# Patient Record
Sex: Male | Born: 1990 | Race: Black or African American | Hispanic: No | Marital: Single | Smoking: Current every day smoker
Health system: Southern US, Community
[De-identification: ages and names within clinical notes are randomized; demographics above are authoritative.]

## PROBLEM LIST (undated history)

## (undated) HISTORY — PX: TONSILLECTOMY: SUR1361

---
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
## 2015-11-26 ENCOUNTER — Encounter: Payer: Self-pay | Admitting: Medical Oncology

## 2015-11-26 ENCOUNTER — Emergency Department
Admission: EM | Admit: 2015-11-26 | Discharge: 2015-11-26 | Disposition: A | Discharge status: Home / Self Care | Payer: 59 | Attending: Emergency Medicine | Admitting: Emergency Medicine

## 2015-11-26 ENCOUNTER — Emergency Department: Payer: 59

## 2015-11-26 DIAGNOSIS — N23 Unspecified renal colic: Secondary | ICD-10-CM

## 2015-11-26 DIAGNOSIS — R1012 Left upper quadrant pain: Secondary | ICD-10-CM | POA: Diagnosis present

## 2015-11-26 DIAGNOSIS — F17200 Nicotine dependence, unspecified, uncomplicated: Secondary | ICD-10-CM | POA: Insufficient documentation

## 2015-11-26 DIAGNOSIS — K2970 Gastritis, unspecified, without bleeding: Secondary | ICD-10-CM | POA: Insufficient documentation

## 2015-11-26 LAB — URINALYSIS COMPLETE WITH MICROSCOPIC (ARMC ONLY)
Bacteria, UA: NONE SEEN
Bilirubin Urine: NEGATIVE
GLUCOSE, UA: NEGATIVE mg/dL
Hgb urine dipstick: NEGATIVE
KETONES UR: NEGATIVE mg/dL
Leukocytes, UA: NEGATIVE
Nitrite: NEGATIVE
PROTEIN: NEGATIVE mg/dL
Specific Gravity, Urine: 1.026 (ref 1.005–1.030)
Squamous Epithelial / LPF: NONE SEEN
pH: 5 (ref 5.0–8.0)

## 2015-11-26 LAB — CBC
HCT: 40 % (ref 40.0–52.0)
HEMOGLOBIN: 13.1 g/dL (ref 13.0–18.0)
MCH: 29.8 pg (ref 26.0–34.0)
MCHC: 32.9 g/dL (ref 32.0–36.0)
MCV: 90.8 fL (ref 80.0–100.0)
PLATELETS: 245 10*3/uL (ref 150–440)
RBC: 4.4 MIL/uL (ref 4.40–5.90)
RDW: 13.5 % (ref 11.5–14.5)
WBC: 7.1 10*3/uL (ref 3.8–10.6)

## 2015-11-26 LAB — COMPREHENSIVE METABOLIC PANEL
ALT: 16 U/L — AB (ref 17–63)
AST: 23 U/L (ref 15–41)
Albumin: 4.3 g/dL (ref 3.5–5.0)
Alkaline Phosphatase: 65 U/L (ref 38–126)
Anion gap: 5 (ref 5–15)
BUN: 14 mg/dL (ref 6–20)
CALCIUM: 9.2 mg/dL (ref 8.9–10.3)
CO2: 27 mmol/L (ref 22–32)
CREATININE: 0.79 mg/dL (ref 0.61–1.24)
Chloride: 108 mmol/L (ref 101–111)
GFR calc Af Amer: >60 mL/min (ref >60)
GFR calc non Af Amer: >60 mL/min (ref >60)
Glucose, Bld: 106 mg/dL — ABNORMAL HIGH (ref 65–99)
Potassium: 4 mmol/L (ref 3.5–5.1)
SODIUM: 140 mmol/L (ref 135–145)
Total Bilirubin: 0.9 mg/dL (ref 0.3–1.2)
Total Protein: 7.4 g/dL (ref 6.5–8.1)

## 2015-11-26 LAB — LIPASE, BLOOD: Lipase: 36 U/L (ref 11–51)

## 2015-11-26 MED ORDER — GI COCKTAIL ~~LOC~~
ORAL | Status: AC
  Filled 2015-11-26: qty 30

## 2015-11-26 MED ORDER — GI COCKTAIL ~~LOC~~
30.0000 mL | Freq: Once | ORAL | Status: AC
  Administered 2015-11-26: 30 mL via ORAL

## 2015-11-26 MED ORDER — PANTOPRAZOLE SODIUM 40 MG PO TBEC: 40.0000 mg | DELAYED_RELEASE_TABLET | Freq: Every day | ORAL | Status: AC

## 2015-11-26 NOTE — ED Notes (Signed)
Pt reports abdominal pain x 1 month, advises it feels like he is hungry.  Tried MOM and it didn't help any.

## 2015-11-26 NOTE — Discharge Instructions (Signed)
Gastritis, Adult Gastritis is soreness and puffiness (inflammation) of the lining of the stomach. If you do not get help, gastritis can cause bleeding and sores (ulcers) in the stomach. HOME CARE   Only take medicine as told by your doctor.  If you were given antibiotic medicines, take them as told. Finish the medicines even if you start to feel better.  Drink enough fluids to keep your pee (urine) clear or pale yellow.  Avoid foods and drinks that make your problems worse. Foods you may want to avoid include:  Caffeine or alcohol.  Chocolate.  Mint.  Garlic and onions.  Spicy foods.  Citrus fruits, including oranges, lemons, or limes.  Food containing tomatoes, including sauce, chili, salsa, and pizza.  Fried and fatty foods.  Eat small meals throughout the day instead of large meals. GET HELP RIGHT AWAY IF:   You have black or dark red poop (stools).  You throw up (vomit) blood. It may look like coffee grounds.  You cannot keep fluids down.  Your belly (abdominal) pain gets worse.  You have a fever.  You do not feel better after 1 week.  You have any other questions or concerns. MAKE SURE YOU:   Understand these instructions.  Will watch your condition.  Will get help right away if you are not doing well or get worse.   This information is not intended to replace advice given to you by your health care provider. Make sure you discuss any questions you have with your health care provider.   Document Released: 05/24/2008 Document Revised: 02/28/2012 Document Reviewed: 01/19/2012 Elsevier Interactive Patient Education 2016 Elsevier Inc.  Renal Colic Renal colic is pain that is caused by passing a kidney stone. The pain can be sharp and severe. It may be felt in the back, abdomen, side (flank), or groin. It can cause nausea. Renal colic can come and go. HOME CARE INSTRUCTIONS Watch your condition for any changes. The following actions may help to lessen any  discomfort that you are feeling:  Take medicines only as directed by your health care provider.  Ask your health care provider if it is okay to take over-the-counter pain medicine.  Drink enough fluid to keep your urine clear or pale yellow. Drink 6-8 glasses of water each day.  Limit the amount of salt that you eat to less than 2 grams per day.  Reduce the amount of protein in your diet. Eat less meat, fish, nuts, and dairy.  Avoid foods such as spinach, rhubarb, nuts, or bran. These may make kidney stones more likely to form. SEEK MEDICAL CARE IF:  You have a fever or chills.  Your urine smells bad or looks cloudy.  You have pain or burning when you pass urine. SEEK IMMEDIATE MEDICAL CARE IF:  Your flank pain or groin pain suddenly worsens.  You become confused or disoriented or you lose consciousness.   This information is not intended to replace advice given to you by your health care provider. Make sure you discuss any questions you have with your health care provider.   Document Released: 09/15/2005 Document Revised: 12/27/2014 Document Reviewed: 10/16/2014 Elsevier Interactive Patient Education [employer]vier Inc.

## 2015-11-26 NOTE — ED Notes (Signed)
Pt reports central abd pain x 1 month, reports that the feeling is a hunger sensation without being hungry. Pt denies dysuria, denies nvd.

## 2015-11-26 NOTE — ED Provider Notes (Signed)
[facility] Emergency Department Provider Note  Time seen: 3:04 PM  I have reviewed the triage vital signs and the nursing notes.   HISTORY  Chief Complaint Abdominal Pain    HPI Joshua Sexton is a 24 y.o. male with no past medical history who presents the emergency department with upper and left-sided abdominal pain intermittently 1 month. According to the patient for the past one month he has intermittently had upper and left-sided abdominal pain. States occasionally the pain is sharp. Upon further questioning the patient states it is somewhat worse after eating especially when eating spicy foods. He takes ibuprofen 3 times a week usually for generalized pains and discomfort. States occasional nausea but denies any vomiting or diarrhea. Denies dysuria or hematuria. Currently states the pain is a burning sensation located in the epigastrium to left sided abdomen rated 6/10.     History reviewed. No pertinent past medical history.  There are no active problems to display for this patient.   Past Surgical History  Procedure Laterality Date  . Tonsillectomy      No current outpatient prescriptions on file.  Allergies Review of patient's allergies indicates no known allergies.  No family history on file.  Social History Social History  Substance Use Topics  . Smoking status: Current Every Day Smoker  . Smokeless tobacco: None  . Alcohol Use: No    Review of Systems Constitutional: Negative for fever. Cardiovascular: Negative for chest pain. Respiratory: Negative for shortness of breath. Gastrointestinal: Upper and left-sided abdominal pain. Positive for nausea. Negative for vomiting or diarrhea. Genitourinary: Negative for dysuria. Negative for hematuria Musculoskeletal: Negative for back pain. Neurological: Negative for headache 10-point ROS otherwise negative.  ____________________________________________   PHYSICAL EXAM:  VITAL  SIGNS: ED Triage Vitals  Enc Vitals Group     BP 11/26/15 1351 132/70 mmHg     Pulse Rate 11/26/15 1351 61     Resp 11/26/15 1351 18     Temp 11/26/15 1351 97.8 F (36.6 C)     Temp Source 11/26/15 1351 Oral     SpO2 11/26/15 1351 100 %     Weight 11/26/15 1351 145 lb (65.772 kg)     Height 11/26/15 1351 6\' 1"  (1.854 m)     Head Cir --      Peak Flow --      Pain Score 11/26/15 1352 7     Pain Loc --      Pain Edu? --      Excl. in GC? --     Constitutional: Alert and oriented. Well appearing and in no distress. Eyes: Normal exam ENT   Head: Normocephalic and atraumatic.   Mouth/Throat: Mucous membranes are moist. Cardiovascular: Normal rate, regular rhythm. No murmur Respiratory: Normal respiratory effort without tachypnea nor retractions. Breath sounds are clear Gastrointestinal: Soft, moderate epigastric and left-sided abdominal tenderness palpation. No rebound or guarding. No distention. Mild left CVA tenderness to palpation. Musculoskeletal: Nontender with normal range of motion in all extremities.  Neurologic:  Normal speech and language. No gross focal neurologic deficits  Skin:  Skin is warm, dry and intact.  Psychiatric: Mood and affect are normal. Speech and behavior are normal. ____________________________________________     INITIAL IMPRESSION / ASSESSMENT AND PLAN / ED COURSE  Pertinent labs & imaging results that were available during my care of the patient were reviewed by me and considered in my medical decision making (see chart for details).  Patient with left-sided epigastric abdominal pain  with tenderness to palpation in the same areas. Overall the patient appears very well. Labs are largely within normal limits, urinalysis pending. Given his left flank pain with left CVA tenderness to palpation we will perform a noncontrasted CT scan to rule out ureterolithiasis. After additional questioning his pain sounds likely more consistent with gastritis  given as it worsens with spicy foods, he takes ibuprofen several times every week, and also states he often has reflux especially at night. We will treat with a GI cocktail while awaiting CT scan results.  CT shows kidney stones bilaterally, however no ureteral lithiasis. We'll place the patient on Protonix and have the patient follow-up with urology as well as GI medicine. The patient is agreeable to this plan. Likely gastritis versus renal colic.  ____________________________________________   FINAL CLINICAL IMPRESSION(S) / ED DIAGNOSES  Gastritis   Minna Antis, MD 11/26/15 1620

## 2015-11-28 ENCOUNTER — Ambulatory Visit: Payer: Self-pay | Admitting: Obstetrics and Gynecology

## 2015-12-01 ENCOUNTER — Encounter: Payer: Self-pay | Admitting: Obstetrics and Gynecology

## 2015-12-01 ENCOUNTER — Ambulatory Visit (INDEPENDENT_AMBULATORY_CARE_PROVIDER_SITE_OTHER): Payer: 59 | Admitting: Obstetrics and Gynecology

## 2015-12-01 VITALS — BP 114/74 | HR 68 | Resp 16 | Ht 73.0 in | Wt 134.0 lb

## 2015-12-01 DIAGNOSIS — R3129 Other microscopic hematuria: Secondary | ICD-10-CM

## 2015-12-01 DIAGNOSIS — N200 Calculus of kidney: Secondary | ICD-10-CM | POA: Diagnosis not present

## 2015-12-01 LAB — URINALYSIS, COMPLETE
Bilirubin, UA: NEGATIVE
Glucose, UA: NEGATIVE
Ketones, UA: NEGATIVE
LEUKOCYTES UA: NEGATIVE
NITRITE UA: NEGATIVE
PH UA: 7 (ref 5.0–7.5)
Protein, UA: NEGATIVE
RBC UA: NEGATIVE
Specific Gravity, UA: 1.02 (ref 1.005–1.030)
Urobilinogen, Ur: 8 mg/dL — ABNORMAL HIGH (ref 0.2–1.0)

## 2015-12-01 LAB — MICROSCOPIC EXAMINATION
EPITHELIAL CELLS (NON RENAL): NONE SEEN /HPF (ref 0–10)
RBC MICROSCOPIC, UA: NONE SEEN /HPF (ref 0–?)

## 2015-12-01 NOTE — Patient Instructions (Addendum)
Kidney Stones Kidney stones (urolithiasis) are deposits that form inside your kidneys. The intense pain is caused by the stone moving through the urinary tract. When the stone moves, the ureter goes into spasm around the stone. The stone is usually passed in the urine.  CAUSES   A disorder that makes certain neck glands produce too much parathyroid hormone (primary hyperparathyroidism).  A buildup of uric acid crystals, similar to gout in your joints.  Narrowing (stricture) of the ureter.  A kidney obstruction present at birth (congenital obstruction).  Previous surgery on the kidney or ureters.  Numerous kidney infections. SYMPTOMS   Feeling sick to your stomach (nauseous).  Throwing up (vomiting).  Blood in the urine (hematuria).  Pain that usually spreads (radiates) to the groin.  Frequency or urgency of urination. DIAGNOSIS   Taking a history and physical exam.  Blood or urine tests.  CT scan.  Occasionally, an examination of the inside of the urinary bladder (cystoscopy) is performed. TREATMENT   Observation.  Increasing your fluid intake.  Extracorporeal shock wave lithotripsy--This is a noninvasive procedure that uses shock waves to break up kidney stones.  Surgery may be needed if you have severe pain or persistent obstruction. There are various surgical procedures. Most of the procedures are performed with the use of small instruments. Only small incisions are needed to accommodate these instruments, so recovery time is minimized. The size, location, and chemical composition are all important variables that will determine the proper choice of action for you. Talk to your health care provider to better understand your situation so that you will minimize the risk of injury to yourself and your kidney.  HOME CARE INSTRUCTIONS   Drink enough water and fluids to keep your urine clear or pale yellow. This will help you to pass the stone or stone fragments.  Strain  all urine through the provided strainer. Keep all particulate matter and stones for your health care provider to see. The stone causing the pain may be as small as a grain of salt. It is very important to use the strainer each and every time you pass your urine. The collection of your stone will allow your health care provider to analyze it and verify that a stone has actually passed. The stone analysis will often identify what you can do to reduce the incidence of recurrences.  Only take over-the-counter or prescription medicines for pain, discomfort, or fever as directed by your health care provider.  Keep all follow-up visits as told by your health care provider. This is important.  Get follow-up X-rays if required. The absence of pain does not always mean that the stone has passed. It may have only stopped moving. If the urine remains completely obstructed, it can cause loss of kidney function or even complete destruction of the kidney. It is your responsibility to make sure X-rays and follow-ups are completed. Ultrasounds of the kidney can show blockages and the status of the kidney. Ultrasounds are not associated with any radiation and can be performed easily in a matter of minutes.  Make changes to your daily diet as told by your health care provider. You may be told to:  Limit the amount of salt that you eat.  Eat 5 or more servings of fruits and vegetables each day.  Limit the amount of meat, poultry, fish, and eggs that you eat.  Collect a 24-hour urine sample as told by your health care provider.You may need to collect another urine sample every 6-12   months. SEEK MEDICAL CARE IF:  You experience pain that is progressive and unresponsive to any pain medicine you have been prescribed. SEEK IMMEDIATE MEDICAL CARE IF:   Pain cannot be controlled with the prescribed medicine.  You have a fever or shaking chills.  The severity or intensity of pain increases over 18 hours and is not  relieved by pain medicine.  You develop a new onset of abdominal pain.  You feel faint or pass out.  You are unable to urinate.   This information is not intended to replace advice given to you by your health care provider. Make sure you discuss any questions you have with your health care provider.   Document Released: 12/06/2005 Document Revised: 08/27/2015 Document Reviewed: 05/09/2013 Elsevier Interactive Patient Education 2016 Elsevier Inc. Kidney Stones Kidney stones (urolithiasis) are deposits that form inside your kidneys. The intense pain is caused by the stone moving through the urinary tract. When the stone moves, the ureter goes into spasm around the stone. The stone is usually passed in the urine.  CAUSES   A disorder that makes certain neck glands produce too much parathyroid hormone (primary hyperparathyroidism).  A buildup of uric acid crystals, similar to gout in your joints.  Narrowing (stricture) of the ureter.  A kidney obstruction present at birth (congenital obstruction).  Previous surgery on the kidney or ureters.  Numerous kidney infections. SYMPTOMS   Feeling sick to your stomach (nauseous).  Throwing up (vomiting).  Blood in the urine (hematuria).  Pain that usually spreads (radiates) to the groin.  Frequency or urgency of urination. DIAGNOSIS   Taking a history and physical exam.  Blood or urine tests.  CT scan.  Occasionally, an examination of the inside of the urinary bladder (cystoscopy) is performed. TREATMENT   Observation.  Increasing your fluid intake.  Extracorporeal shock wave lithotripsy--This is a noninvasive procedure that uses shock waves to break up kidney stones.  Surgery may be needed if you have severe pain or persistent obstruction. There are various surgical procedures. Most of the procedures are performed with the use of small instruments. Only small incisions are needed to accommodate these instruments, so  recovery time is minimized. The size, location, and chemical composition are all important variables that will determine the proper choice of action for you. Talk to your health care provider to better understand your situation so that you will minimize the risk of injury to yourself and your kidney.  HOME CARE INSTRUCTIONS   Drink enough water and fluids to keep your urine clear or pale yellow. This will help you to pass the stone or stone fragments.  Strain all urine through the provided strainer. Keep all particulate matter and stones for your health care provider to see. The stone causing the pain may be as small as a grain of salt. It is very important to use the strainer each and every time you pass your urine. The collection of your stone will allow your health care provider to analyze it and verify that a stone has actually passed. The stone analysis will often identify what you can do to reduce the incidence of recurrences.  Only take over-the-counter or prescription medicines for pain, discomfort, or fever as directed by your health care provider.  Keep all follow-up visits as told by your health care provider. This is important.  Get follow-up X-rays if required. The absence of pain does not always mean that the stone has passed. It may have only stopped moving. If the  urine remains completely obstructed, it can cause loss of kidney function or even complete destruction of the kidney. It is your responsibility to make sure X-rays and follow-ups are completed. Ultrasounds of the kidney can show blockages and the status of the kidney. Ultrasounds are not associated with any radiation and can be performed easily in a matter of minutes.  Make changes to your daily diet as told by your health care provider. You may be told to:  Limit the amount of salt that you eat.  Eat 5 or more servings of fruits and vegetables each day.  Limit the amount of meat, poultry, fish, and eggs that you  eat.  Collect a 24-hour urine sample as told by your health care provider.You may need to collect another urine sample every 6-12 months. SEEK MEDICAL CARE IF:  You experience pain that is progressive and unresponsive to any pain medicine you have been prescribed. SEEK IMMEDIATE MEDICAL CARE IF:   Pain cannot be controlled with the prescribed medicine.  You have a fever or shaking chills.  The severity or intensity of pain increases over 18 hours and is not relieved by pain medicine.  You develop a new onset of abdominal pain.  You feel faint or pass out.  You are unable to urinate.   This information is not intended to replace advice given to you by your health care provider. Make sure you discuss any questions you have with your health care provider.   Document Released: 12/06/2005 Document Revised: 08/27/2015 Document Reviewed: 05/09/2013 Elsevier Interactive Patient Education 2016 Elsevier Inc. Dietary Guidelines to Help Prevent Kidney Stones Your risk of kidney stones can be decreased by adjusting the foods you eat. The most important thing you can do is drink enough fluid. You should drink enough fluid to keep your urine clear or pale yellow. The following guidelines provide specific information for the type of kidney stone you have had. GUIDELINES ACCORDING TO TYPE OF KIDNEY STONE Calcium Oxalate Kidney Stones Reduce the amount of salt you eat. Foods that have a lot of salt cause your body to release excess calcium into your urine. The excess calcium can combine with a substance called oxalate to form kidney stones. Reduce the amount of animal protein you eat if the amount you eat is excessive. Animal protein causes your body to release excess calcium into your urine. Ask your dietitian how much protein from animal sources you should be eating. Avoid foods that are high in oxalates. If you take vitamins, they should have less than 500 mg of vitamin C. Your body turns vitamin C  into oxalates. You do not need to avoid fruits and vegetables high in vitamin C. Calcium Phosphate Kidney Stones Reduce the amount of salt you eat to help prevent the release of excess calcium into your urine. Reduce the amount of animal protein you eat if the amount you eat is excessive. Animal protein causes your body to release excess calcium into your urine. Ask your dietitian how much protein from animal sources you should be eating. Get enough calcium from food or take a calcium supplement (ask your dietitian for recommendations). Food sources of calcium that do not increase your risk of kidney stones include: Broccoli. Dairy products, such as cheese and yogurt. Pudding. Uric Acid Kidney Stones Do not have more than 6 oz of animal protein per day. FOOD SOURCES Animal Protein Sources Meat (all types). Poultry. Eggs. Fish, seafood. Foods High in [employer] seasonings. Soy sauce. Teriyaki sauce. Cured  and processed meats. Salted crackers and snack foods. Fast food. Canned soups and most canned foods. Foods High in Oxalates Grains: Amaranth. Barley. Grits. Wheat germ. Bran. Buckwheat flour. All bran cereals. Pretzels. Whole wheat bread. Vegetables: Beans (wax). Beets and beet greens. Collard greens. Eggplant. [place]. Leeks. Okra. Parsley. Rutabagas. Spinach. Swiss chard. Tomato paste. Fried potatoes. Sweet potatoes. Fruits: Red currants. Figs. Kiwi. Rhubarb. Meat and Other Protein Sources: Beans (dried). Soy burgers and other soybean products. Miso. Nuts (peanuts, almonds, pecans, cashews, hazelnuts). Nut butters. Sesame seeds and tahini (paste made of sesame seeds). Poppy seeds. Beverages: Chocolate drink mixes. Soy milk. Instant iced tea. Juices made from high-oxalate fruits or vegetables. Other: Carob. Chocolate. Fruitcake. Marmalades.   This information is not intended to replace advice given to you by your health care provider. Make  sure you discuss any questions you have with your health care provider.   Document Released: 04/02/2011 Document Revised: 12/11/2013 Document Reviewed: 11/02/2013 Elsevier Interactive Patient Education [employer]

## 2015-12-01 NOTE — Progress Notes (Signed)
12/01/2015 4:37 PM   Joshua Sexton [birth year] [id number]  Referring provider: Derwood Kaplan, MD [address] [place], [place] [address]  Chief Complaint  Patient presents with  . Nephrolithiasis    Bilateral  . Establish Care    HPI: Patient is a 24 year old male presenting today for ER follow-up after being seen with complaints of left upper quadrant and left flank pain. CT renal stone study was performed noting a 3 x 4 mm discrete stone in the upper pole of the left kidney and a 1 mm punctate stone in the midportion of the right kidney. There was no evidence of passing stones or hydronephrosis. 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  Patient was also prescribed Protonix for presumed gastritis pain. He reports that his abdominal and flank pain has completely resolved. He denies any urinary symptoms including frequency, urgency, dysuria or gross hematuria. No fevers or chills.  No family history of renal stones.  11/26/15 CMP- WNL CBC- WNL UA 0-5 RBCs, no proteinuria     PMH: History reviewed. No pertinent past medical history.  Surgical History: Past Surgical History  Procedure Laterality Date  . Tonsillectomy      Home Medications:    Medication List       This list is accurate as of: 12/01/15  4:37 PM.  Always use your most recent med list.               pantoprazole 40 MG tablet  Commonly known as:  PROTONIX  Take 1 tablet (40 mg total) by mouth daily.        Allergies: No Known Allergies  Family History: Family History  Problem Relation Age of Onset  . Hypertension Mother   . Diabetes Mother   . Hypertension Maternal Aunt   . Hypertension Maternal Uncle   . Hypertension Maternal Grandmother     Social History:  reports that he has been smoking.  He does not have any smokeless tobacco history on file. He reports that he does not drink alcohol. His drug history is not on file.  ROS: UROLOGY Frequent  Urination?: No Hard to postpone urination?: No Burning/pain with urination?: No Get up at night to urinate?: No Leakage of urine?: No Urine stream starts and stops?: No Trouble starting stream?: No Do you have to strain to urinate?: No Blood in urine?: No Urinary tract infection?: No Sexually transmitted disease?: No Injury to kidneys or bladder?: No Painful intercourse?: No Weak stream?: No Erection problems?: No Penile pain?: No  Gastrointestinal Nausea?: No Vomiting?: Yes Indigestion/heartburn?: No Diarrhea?: No Constipation?: No  Constitutional Fever: No Night sweats?: No Weight loss?: Yes Fatigue?: No  Skin Skin rash/lesions?: No Itching?: No  Eyes Blurred vision?: No Double vision?: No  Ears/Nose/Throat Sore throat?: No Sinus problems?: No  Hematologic/Lymphatic Swollen glands?: No Easy bruising?: No  Cardiovascular Leg swelling?: No Chest pain?: No  Respiratory Cough?: No Shortness of breath?: No  Endocrine Excessive thirst?: No  Musculoskeletal Back pain?: No Joint pain?: No  Neurological Headaches?: No Dizziness?: No  Psychologic Depression?: No Anxiety?: No  Physical Exam: BP 114/74 mmHg  Pulse 68  Resp 16  Ht  (1.854 m)  Wt 134 lb (60.782 kg)  BMI 17.68 kg/m2  Constitutional:  Alert and oriented, No acute distress. HEENT: [place] AT, moist mucus membranes.  Trachea midline, no masses. Cardiovascular: No clubbing, cyanosis, or edema. Respiratory: Normal respiratory effort, no increased work of breathing. GI: Abdomen is soft, nontender, nondistended,  no abdominal masses GU: No CVA tenderness.  Skin: No rashes, bruises or suspicious lesions. Lymph: No cervical or inguinal adenopathy. Neurologic: Grossly intact, no focal deficits, moving all 4 extremities. Psychiatric: Normal mood and affect.  Laboratory Data:   Urinalysis Results for orders placed or performed in visit on 12/01/15  Microscopic Examination  Result  Value Ref Range   WBC, UA 0-5 0 -  5 /hpf   RBC, UA None seen 0 -  2 /hpf   Epithelial Cells (non renal) None seen 0 - 10 /hpf   Mucus, UA Present (A) Not Estab.   Bacteria, UA Few (A) None seen/Few  Urinalysis, Complete  Result Value Ref Range   Specific Gravity, UA 1.020 1.005 - 1.030   pH, UA 7.0 5.0 - 7.5   Color, UA Yellow Yellow   Appearance Ur Clear Clear   Leukocytes, UA Negative Negative   Protein, UA Negative Negative/Trace   Glucose, UA Negative Negative   Ketones, UA Negative Negative   RBC, UA Negative Negative   Bilirubin, UA Negative Negative   Urobilinogen, Ur 8.0 (H) 0.2 - 1.0 mg/dL   Nitrite, UA Negative Negative   Microscopic Examination See below:      Pertinent Imaging: CLINICAL DATA: Central abdominal pain and left flank pain, 1 month duration.  EXAM: CT ABDOMEN AND PELVIS WITHOUT CONTRAST  TECHNIQUE: Multidetector CT imaging of the abdomen and pelvis was performed following the standard protocol without IV contrast.  COMPARISON: None.  FINDINGS: Lung bases are clear. No pleural or pericardial fluid. The liver is normal without contrast. No calcified gallstones. The spleen is normal. The pancreas is normal. The adrenal glands are normal. The left kidney contains a 3 x 4 mm stone in the upper pole. There is hyperdensity in the renal medullary regions probably representing medullary nephrocalcinosis. There is no hydroureteronephrosis. No stone in the left ureter. On the right, there is hyperdensity in the medullary regions consistent with medullary nephrocalcinosis. There is a 1 mm stone in the midportion. No hydronephrosis. No passing stone. No stone in the bladder.  The aorta and IVC are normal. No retroperitoneal mass or adenopathy. No free intraperitoneal fluid or air. No significant bone finding.  IMPRESSION: Hyperdensity of the medullary regions of both kidneys consistent with medullary nephrocalcinosis.  3 x 4 mm discrete  stone in the upper pole the left kidney. No evidence of hydronephrosis or passing stone on the left.  1 mm discrete stone in the midportion of the right kidney. No evidence of passing stone or hydronephrosis.  Electronically Signed  By: Paulina Fusi M.D.  On: 11/26/2015 15:58   Assessment & Plan:   1. Kidney Stones- 3 x 4 mm stone in the upper pole of the left kidney as well as a 1 mm punctate stone in the midportion of the right kidney. There was no evidence of a passing stone or hydronephrosis on recent renal stone study. We discussed general stone prevention techniques including drinking plenty water with goal of producing 2.5 L urine daily, increased citric acid intake, avoidance of high oxalate containing foods, and decreased salt intake.  Information about dietary recommendations given today.  24 hour urine study discussed and we will proceed given patient's young age and multiple renal stones. Will schedule follow-up to review results after study is completed.   2. Microscopic Hematuria- 0-5 RBCs seen on microscopic urinalysis in the emergency department. UA unremarkable today. Patient may have passed a small renal stone. No ureteral stones or signs of  obstruction were noted on CT renal stone study. Will recheck at next visit.   There are no diagnoses linked to this encounter.  No Follow-up on file.  These notes generated with voice recognition software. I apologize for typographical errors.  Earlie LouLindsay Kiowa Peifer, FNP  [facility] [address], [address] [place], [place] [address] [phone]

## 2015-12-25 ENCOUNTER — Encounter: Payer: Self-pay | Admitting: *Deleted

## 2015-12-26 ENCOUNTER — Ambulatory Visit: Payer: 59 | Admitting: Registered Nurse

## 2015-12-26 ENCOUNTER — Encounter: Payer: Self-pay | Admitting: *Deleted

## 2015-12-26 ENCOUNTER — Ambulatory Visit
Admission: RE | Admit: 2015-12-26 | Discharge: 2015-12-26 | Disposition: A | Discharge status: Home / Self Care | Payer: 59 | Source: Ambulatory Visit | Attending: Gastroenterology | Admitting: Gastroenterology

## 2015-12-26 ENCOUNTER — Encounter
Admission: RE | Disposition: A | Discharge status: Home / Self Care | Payer: Self-pay | Source: Ambulatory Visit | Attending: Gastroenterology

## 2015-12-26 DIAGNOSIS — F17210 Nicotine dependence, cigarettes, uncomplicated: Secondary | ICD-10-CM | POA: Diagnosis not present

## 2015-12-26 DIAGNOSIS — A048 Other specified bacterial intestinal infections: Secondary | ICD-10-CM | POA: Diagnosis not present

## 2015-12-26 DIAGNOSIS — K2950 Unspecified chronic gastritis without bleeding: Secondary | ICD-10-CM | POA: Diagnosis not present

## 2015-12-26 DIAGNOSIS — Z79899 Other long term (current) drug therapy: Secondary | ICD-10-CM | POA: Diagnosis not present

## 2015-12-26 DIAGNOSIS — R112 Nausea with vomiting, unspecified: Secondary | ICD-10-CM | POA: Diagnosis not present

## 2015-12-26 DIAGNOSIS — R1013 Epigastric pain: Secondary | ICD-10-CM | POA: Diagnosis not present

## 2015-12-26 HISTORY — PX: ESOPHAGOGASTRODUODENOSCOPY (EGD) WITH PROPOFOL: SHX5813

## 2015-12-26 SURGERY — ESOPHAGOGASTRODUODENOSCOPY (EGD) WITH PROPOFOL
Anesthesia: General

## 2015-12-26 MED ORDER — MIDAZOLAM HCL 2 MG/2ML IJ SOLN
INTRAMUSCULAR | Status: DC | PRN
  Administered 2015-12-26: 1 mg via INTRAVENOUS

## 2015-12-26 MED ORDER — PROPOFOL 10 MG/ML IV BOLUS
INTRAVENOUS | Status: DC | PRN
  Administered 2015-12-26: 50 mg via INTRAVENOUS

## 2015-12-26 MED ORDER — SODIUM CHLORIDE 0.9 % IV SOLN
INTRAVENOUS | Status: DC
  Administered 2015-12-26: 11:00:00 via INTRAVENOUS

## 2015-12-26 MED ORDER — FENTANYL CITRATE (PF) 100 MCG/2ML IJ SOLN
INTRAMUSCULAR | Status: DC | PRN
  Administered 2015-12-26: 50 ug via INTRAVENOUS

## 2015-12-26 MED ORDER — GLYCOPYRROLATE 0.2 MG/ML IJ SOLN
INTRAMUSCULAR | Status: DC | PRN
  Administered 2015-12-26: 0.2 mg via INTRAVENOUS

## 2015-12-26 MED ORDER — PROPOFOL 500 MG/50ML IV EMUL
INTRAVENOUS | Status: DC | PRN
  Administered 2015-12-26: 170 ug/kg/min via INTRAVENOUS

## 2015-12-26 MED ORDER — LIDOCAINE HCL (CARDIAC) 20 MG/ML IV SOLN
INTRAVENOUS | Status: DC | PRN
  Administered 2015-12-26: 40 mg via INTRAVENOUS

## 2015-12-26 NOTE — Op Note (Signed)
[facility] Gastroenterology Patient Name: Joshua Sexton Procedure Date: 12/26/2015 11:13 AM MRN: [id number] Account #: [id number] Date of Birth: January 22, 1991 Admit Type: Outpatient Age: 25 Room: [facility] ENDO ROOM 4 Gender: Male Note Status: Finalized Procedure:         Upper GI endoscopy Indications:       Epigastric abdominal pain, Suspected esophageal reflux,                     Nausea with vomiting Providers:         Ezzard Standing. Bluford Kaufmann, MD Referring MD:      Sallye Lat Md, MD (Referring MD) Medicines:         Monitored Anesthesia Care Complications:     No immediate complications. Procedure:         Pre-Anesthesia Assessment:                    - Prior to the procedure, a History and Physical was                     performed, and patient medications, allergies and                     sensitivities were reviewed. The patient's tolerance of                     previous anesthesia was reviewed.                    - The risks and benefits of the procedure and the sedation                     options and risks were discussed with the patient. All                     questions were answered and informed consent was obtained.                    - After reviewing the risks and benefits, the patient was                     deemed in satisfactory condition to undergo the procedure.                    After obtaining informed consent, the endoscope was passed                     under direct vision. Throughout the procedure, the                     patient's blood pressure, pulse, and oxygen saturations                     were monitored continuously. The Olympus GIF-160 endoscope                     (S#. [id number]) was introduced through the mouth, and                     advanced to the second part of duodenum. The upper GI                     endoscopy was accomplished without difficulty. The patient  tolerated the procedure well. Findings:      The examined  esophagus was normal.      The entire examined stomach was normal. Biopsies were taken with a cold       forceps for Helicobacter pylori testing.      The examined duodenum was normal. Impression:        - Normal esophagus.                    - Normal stomach. Biopsied.                    - Normal examined duodenum. Recommendation:    - Discharge patient to home.                    - Observe patient's clinical course.                    - Continue present medications.                    - Continue present medications.                    - Await pathology results.                    - The findings and recommendations were discussed with the                     patient. Procedure Code(s): --- Professional ---                    [id number], Esophagogastroduodenoscopy, flexible, transoral;                     with biopsy, single or multiple CPT copyright 2014 American Medical Association. All rights reserved. The codes documented in this report are preliminary and upon coder review may  be revised to meet current compliance requirements. Wallace CullensPaul Y Guida Asman, MD 12/26/2015 11:21:49 AM This report has been signed electronically. Number of Addenda: 0 Note Initiated On: 12/26/2015 11:13 AM      [facility]

## 2015-12-26 NOTE — H&P (Signed)
  Date of Initial H&P:12/01/2015  History reviewed, patient examined, no change in status, stable for surgery. 

## 2015-12-26 NOTE — Anesthesia Postprocedure Evaluation (Signed)
Anesthesia Post Note  Patient: Joshua Sexton  Procedure(s) Performed: Procedure(s) (LRB): ESOPHAGOGASTRODUODENOSCOPY (EGD) WITH PROPOFOL (N/A)  Patient location during evaluation: PACU Anesthesia Type: General Level of consciousness: awake and alert Pain management: satisfactory to patient Vital Signs Assessment: post-procedure vital signs reviewed and stable Respiratory status: nonlabored ventilation Cardiovascular status: stable Anesthetic complications: no    Last Vitals:  Filed Vitals:   12/26/15 1128 12/26/15 1201  BP: 113/62 106/69  Pulse: 55   Temp: 36.2 C   Resp: 12     Last Pain:  Filed Vitals:   12/26/15 1201  PainSc: 0-No pain                 VAN STAVEREN,Archie Shea

## 2015-12-26 NOTE — Anesthesia Preprocedure Evaluation (Signed)
Anesthesia Evaluation  Patient identified by MRN, date of birth, ID band Patient awake    Reviewed: Allergy & Precautions, NPO status   History of Anesthesia Complications Negative for: history of anesthetic complications  Airway Mallampati: II       Dental  (+) Teeth Intact   Pulmonary neg pulmonary ROS, Current Smoker,    Pulmonary exam normal        Cardiovascular negative cardio ROS   Rhythm:Regular     Neuro/Psych    GI/Hepatic negative GI ROS, Neg liver ROS,   Endo/Other  negative endocrine ROS  Renal/GU negative Renal ROS     Musculoskeletal   Abdominal Normal abdominal exam  (+)   Peds  Hematology negative hematology ROS (+)   Anesthesia Other Findings   Reproductive/Obstetrics                             Anesthesia Physical Anesthesia Plan  ASA: II  Anesthesia Plan: General   Post-op Pain Management:    Induction: Intravenous  Airway Management Planned: Nasal Cannula  Additional Equipment:   Intra-op Plan:   Post-operative Plan:   Informed Consent: I have reviewed the patients History and Physical, chart, labs and discussed the procedure including the risks, benefits and alternatives for the proposed anesthesia with the patient or authorized representative who has indicated his/her understanding and acceptance.     Plan Discussed with: CRNA  Anesthesia Plan Comments:         Anesthesia Quick Evaluation

## 2015-12-26 NOTE — Anesthesia Procedure Notes (Signed)
Date/Time: 12/26/2015 11:12 AM Performed by: Stormy FabianURTIS, Deny Chevez Pre-anesthesia Checklist: Patient identified, Emergency Drugs available, Suction available and Patient being monitored Patient Re-evaluated:Patient Re-evaluated prior to inductionOxygen Delivery Method: Nasal cannula Intubation Type: IV induction Dental Injury: Teeth and Oropharynx as per pre-operative assessment  Comments: Nasal cannula with etCO2 monitoring

## 2015-12-26 NOTE — Transfer of Care (Signed)
Immediate Anesthesia Transfer of Care Note  Patient: Joshua Sexton  Procedure(s) Performed: Procedure(s): ESOPHAGOGASTRODUODENOSCOPY (EGD) WITH PROPOFOL (N/A)  Patient Location: PACU and Endoscopy Unit  Anesthesia Type:General  Level of Consciousness: sedated  Airway & Oxygen Therapy: Patient Spontanous Breathing and Patient connected to nasal cannula oxygen  Post-op Assessment: Report given to RN and Post -op Vital signs reviewed and stable  Post vital signs: Reviewed and stable  Last Vitals:  Filed Vitals:   12/26/15 1036 12/26/15 1128  BP: 132/87 113/62  Pulse: 57 55  Temp: 35.6 C 36.2 C  Resp: 12 12    Complications: No apparent anesthesia complications

## 2015-12-29 LAB — SURGICAL PATHOLOGY

## 2016-01-01 ENCOUNTER — Encounter: Payer: Self-pay | Admitting: Gastroenterology

## 2016-05-11 ENCOUNTER — Encounter: Payer: Self-pay | Admitting: Urology

## 2016-05-11 ENCOUNTER — Ambulatory Visit (INDEPENDENT_AMBULATORY_CARE_PROVIDER_SITE_OTHER): Payer: 59 | Admitting: Urology

## 2016-05-11 VITALS — BP 122/78 | HR 54 | Ht 72.0 in | Wt 136.8 lb

## 2016-05-11 DIAGNOSIS — R109 Unspecified abdominal pain: Secondary | ICD-10-CM | POA: Diagnosis not present

## 2016-05-11 DIAGNOSIS — N200 Calculus of kidney: Secondary | ICD-10-CM | POA: Diagnosis not present

## 2016-05-11 LAB — URINALYSIS, COMPLETE
Bilirubin, UA: NEGATIVE
Glucose, UA: NEGATIVE
Ketones, UA: NEGATIVE
Leukocytes, UA: NEGATIVE
NITRITE UA: NEGATIVE
PH UA: 6 (ref 5.0–7.5)
PROTEIN UA: NEGATIVE
RBC, UA: NEGATIVE
Specific Gravity, UA: 1.025 (ref 1.005–1.030)
UUROB: 2 mg/dL — AB (ref 0.2–1.0)

## 2016-05-11 LAB — MICROSCOPIC EXAMINATION: BACTERIA UA: NONE SEEN

## 2016-05-11 NOTE — Progress Notes (Signed)
2:22 PM   Joshua Sexton 07/26/1991 [id number]  Referring provider: No referring provider defined for this encounter.  Chief Complaint  Patient presents with  . Back Pain    patient states low back pain and hx of stones    HPI: Patient is a 25 year old African-American male who presents today complaining of left-sided flank pain.  Background history Patient presented to us on 12/01/2015 for ER follow-up after being seen with complaints of left upper quadrant and left flank pain. CT renal stone study was performed noting a 3 x 4 mm discrete stone in the upper pole of the left kidney and a 1 mm punctate stone in the midportion of the right kidney. There was no evidence of passing stones or hydronephrosis. 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  No family history of renal stones.  He has been having left sided flank pain for the last 4 to 10 days.  He has not had any gross hematuria.  The pain can reach levels of 10, but it is usually around a 5.  He states the pain is worse while he is at work. He states nothing seems to help the pain. He has not noted any radiation of the pain. He has not had fevers, chills, nausea or vomiting.   His UA today was unremarkable.     PMH: No past medical history on file.  Surgical History: Past Surgical History  Procedure Laterality Date  . Tonsillectomy    . Esophagogastroduodenoscopy (egd) with propofol N/A 12/26/2015    Procedure: ESOPHAGOGASTRODUODENOSCOPY (EGD) WITH PROPOFOL;  Surgeon: Wallace CullensPaul Y Oh, MD;  [facility] END[facility]COPY;  Service: Gastroenterology;  Laterality: N/A;    Home Medications:    Medication List       This list is accurate as of: 05/11/16  2:22 PM.  Always use your most recent med list.               doxycycline 100 MG tablet  Commonly known as:  VIBRA-TABS  Reported on 05/11/2016     metroNIDAZOLE 250 MG tablet  Commonly known as:  FLAGYL  Reported on 05/11/2016      ondansetron 4 MG disintegrating tablet  Commonly known as:  ZOFRAN-ODT  Take by mouth. Reported on 05/11/2016     pantoprazole 40 MG tablet  Commonly known as:  PROTONIX  Take 1 tablet (40 mg total) by mouth daily.     PEPTO-BISMOL 262 MG chewable tablet  Generic drug:  bismuth subsalicylate  Reported on 05/11/2016        Allergies:  Allergies  Allergen Reactions  . Bismuth Subcarbonate Nausea And Vomiting    Family History: Family History  Problem Relation Age of Onset  . Hypertension Mother   . Diabetes Mother   . Hypertension Maternal Aunt   . Hypertension Maternal Uncle   . Hypertension Maternal Grandmother     Social History:  reports that he has been smoking.  He has never used smokeless tobacco. He reports that he does not drink alcohol or use illicit drugs.  ROS: UROLOGY Frequent Urination?: No Hard to postpone urination?: No Burning/pain with urination?: No Get up at night to urinate?: No Leakage of urine?: No Urine stream starts and stops?: No Trouble starting stream?: No Do you have to strain to urinate?: No Blood in urine?: No Urinary tract infection?: No Sexually transmitted disease?: No Injury to kidneys or bladder?: No Painful intercourse?: No Weak stream?: No Erection problems?: No Penile pain?: No  Gastrointestinal Nausea?: No Vomiting?: No Indigestion/heartburn?: No Diarrhea?: No Constipation?: No  Constitutional Fever: No Night sweats?: No Weight loss?: No Fatigue?: No  Skin Skin rash/lesions?: No Itching?: No  Eyes Blurred vision?: No Double vision?: No  Ears/Nose/Throat Sore throat?: No Sinus problems?: No  Hematologic/Lymphatic Swollen glands?: No Easy bruising?: No  Cardiovascular Leg swelling?: No Chest pain?: No  Respiratory Cough?: No Shortness of breath?: No  Endocrine Excessive thirst?: No  Musculoskeletal Back pain?: Yes Joint pain?: Yes  Neurological Headaches?: No Dizziness?:  No  Psychologic Depression?: No Anxiety?: No  Physical Exam: BP 122/78 mmHg  Pulse 54  Ht 6' (1.829 m)  Wt 136 lb 12.8 oz (62.052 kg)  BMI 18.55 kg/m2  Constitutional:  Alert and oriented, No acute distress. HEENT: [place] AT, moist mucus membranes.  Trachea midline, no masses. Cardiovascular: No clubbing, cyanosis, or edema. Respiratory: Normal respiratory effort, no increased work of breathing. GI: Abdomen is soft, nontender, nondistended, no abdominal masses GU: No CVA tenderness.  Skin: No rashes, bruises or suspicious lesions. Lymph: No cervical or inguinal adenopathy. Neurologic: Grossly intact, no focal deficits, moving all 4 extremities. Psychiatric: Normal mood and affect.  Laboratory Data: Urinalysis Results for orders placed or performed in visit on 05/11/16  Microscopic Examination  Result Value Ref Range   WBC, UA 0-5 0 -  5 /hpf   RBC, UA 0-2 0 -  2 /hpf   Epithelial Cells (non renal) 0-10 0 - 10 /hpf   Bacteria, UA None seen None seen/Few  Urinalysis, Complete  Result Value Ref Range   Specific Gravity, UA 1.025 1.005 - 1.030   pH, UA 6.0 5.0 - 7.5   Color, UA Yellow Yellow   Appearance Ur Clear Clear   Leukocytes, UA Negative Negative   Protein, UA Negative Negative/Trace   Glucose, UA Negative Negative   Ketones, UA Negative Negative   RBC, UA Negative Negative   Bilirubin, UA Negative Negative   Urobilinogen, Ur 2.0 (H) 0.2 - 1.0 mg/dL   Nitrite, UA Negative Negative   Microscopic Examination See below:      Pertinent Imaging: CLINICAL DATA: Central abdominal pain and left flank pain, 1 month duration.  EXAM: CT ABDOMEN AND PELVIS WITHOUT CONTRAST  TECHNIQUE: Multidetector CT imaging of the abdomen and pelvis was performed following the standard protocol without IV contrast.  COMPARISON: None.  FINDINGS: Lung bases are clear. No pleural or pericardial fluid. The liver is normal without contrast. No calcified gallstones. The spleen  is normal. The pancreas is normal. The adrenal glands are normal. The left kidney contains a 3 x 4 mm stone in the upper pole. There is hyperdensity in the renal medullary regions probably representing medullary nephrocalcinosis. There is no hydroureteronephrosis. No stone in the left ureter. On the right, there is hyperdensity in the medullary regions consistent with medullary nephrocalcinosis. There is a 1 mm stone in the midportion. No hydronephrosis. No passing stone. No stone in the bladder.  The aorta and IVC are normal. No retroperitoneal mass or adenopathy. No free intraperitoneal fluid or air. No significant bone finding.  IMPRESSION: Hyperdensity of the medullary regions of both kidneys consistent with medullary nephrocalcinosis.  3 x 4 mm discrete stone in the upper pole the left kidney. No evidence of hydronephrosis or passing stone on the left.  1 mm discrete stone in the midportion of the right kidney. No evidence of passing stone or hydronephrosis.  Electronically Signed  By: Paulina Fusi M.D.  On: 11/26/2015 15:58  Assessment & Plan:   1. Left flank pain:   Patient with the sudden onset of left-sided flank pain.   He does have a prior history of nephrolithiasis.   We'll obtain a CT renal stone study to see if the left upper pole stone has migrated into the ureter.   He should contact our office or seek treatment in the ED if he becomes febrile or pain and difficult control in order to arrange for emergent/urgent intervention  2. Kidney Stones:   3 x 4 mm stone in the upper pole of the left kidney as well as a 1 mm punctate stone in the midportion of the right kidney were seen on a CT Renal stone study 11/26/2015.        Return for CT Renal stone report.  These notes generated with voice recognition software. I apologize for typographical errors.  Michiel Cowboy, PA-C  [facility] [address], [address] [place], [place]  [address] [phone]

## 2016-05-12 ENCOUNTER — Emergency Department
Admission: EM | Admit: 2016-05-12 | Discharge: 2016-05-12 | Disposition: A | Discharge status: Home / Self Care | Payer: 59 | Attending: Emergency Medicine | Admitting: Emergency Medicine

## 2016-05-12 ENCOUNTER — Emergency Department: Payer: 59

## 2016-05-12 ENCOUNTER — Encounter: Payer: Self-pay | Admitting: Emergency Medicine

## 2016-05-12 DIAGNOSIS — Z792 Long term (current) use of antibiotics: Secondary | ICD-10-CM | POA: Diagnosis not present

## 2016-05-12 DIAGNOSIS — N50812 Left testicular pain: Secondary | ICD-10-CM | POA: Diagnosis not present

## 2016-05-12 DIAGNOSIS — F17210 Nicotine dependence, cigarettes, uncomplicated: Secondary | ICD-10-CM | POA: Insufficient documentation

## 2016-05-12 DIAGNOSIS — Z79899 Other long term (current) drug therapy: Secondary | ICD-10-CM | POA: Insufficient documentation

## 2016-05-12 LAB — URINALYSIS COMPLETE WITH MICROSCOPIC (ARMC ONLY)
BILIRUBIN URINE: NEGATIVE
Bacteria, UA: NONE SEEN
GLUCOSE, UA: NEGATIVE mg/dL
Hgb urine dipstick: NEGATIVE
Ketones, ur: NEGATIVE mg/dL
LEUKOCYTES UA: NEGATIVE
Nitrite: NEGATIVE
Protein, ur: NEGATIVE mg/dL
RBC / HPF: NONE SEEN RBC/hpf (ref 0–5)
Specific Gravity, Urine: 1.028 (ref 1.005–1.030)
pH: 5 (ref 5.0–8.0)

## 2016-05-12 NOTE — ED Notes (Signed)
Per Dr. Mayford KnifeWilliams, based on patient's symptoms, no need for US of testicle at this time.

## 2016-05-12 NOTE — ED Provider Notes (Signed)
Time Seen: Approximately *1830 I have reviewed the triage notes  Chief Complaint: Back Pain and Testicle Pain   History of Present Illness: Joshua Sexton is a 25 y.o. male *who states he's noticed some left lower back pain and left testicular pain over the last of 4 days. The flank pain was noticed over the last 2 weeks. He states he notices less of the flank pain and now is more sore in his left testicle area. He's describes it as pain that usually when he is at work and lifting heavy objects. He denies much in way of significant pain now but states his testicle is "" sensitive "". He denies any penile discharge or drainage. He is sexually active though with a single partner.   History reviewed. No pertinent past medical history.  There are no active problems to display for this patient.   Past Surgical History  Procedure Laterality Date  . Tonsillectomy    . Esophagogastroduodenoscopy (egd) with propofol N/A 12/26/2015    Procedure: ESOPHAGOGASTRODUODENOSCOPY (EGD) WITH PROPOFOL;  Surgeon: Wallace CullensPaul Y Oh, MD;  Location[facility];  Service: Gastroenterology;  Laterality: N/A;    Past Surgical History  Procedure Laterality Date  . Tonsillectomy    . Esophagogastroduodenoscopy (egd) with propofol N/A 12/26/2015    Procedure: ESOPHAGOGASTRODUODENOSCOPY (EGD) WITH PROPOFOL;  Surgeon: Wallace CullensPaul Y Oh, MD;  [facility];  Service: Gastroenterology;  Laterality: N/A;    Current Outpatient Rx  Name  Route  Sig  Dispense  Refill  . bismuth subsalicylate (PEPTO-BISMOL) 262 MG chewable tablet      Reported on 05/11/2016         . doxycycline (VIBRA-TABS) 100 MG tablet      Reported on 05/11/2016         . metroNIDAZOLE (FLAGYL) 250 MG tablet      Reported on 05/11/2016         . ondansetron (ZOFRAN-ODT) 4 MG disintegrating tablet   Oral   Take by mouth. Reported on 05/11/2016         . pantoprazole (PROTONIX) 40 MG tablet   Oral   Take 1 tablet (40 mg total) by  mouth daily. Patient not taking: Reported on 05/11/2016   30 tablet   1     Allergies:  Bismuth subcarbonate  Family History: Family History  Problem Relation Age of Onset  . Hypertension Mother   . Diabetes Mother   . Hypertension Maternal Aunt   . Hypertension Maternal Uncle   . Hypertension Maternal Grandmother     Social History: Social History  Substance Use Topics  . Smoking status: Current Every Day Smoker -- 0.50 packs/day    Types: Cigarettes  . Smokeless tobacco: Never Used  . Alcohol Use: No     Review of Systems:   10 point review of systems was performed and was otherwise negative:  Constitutional: No fever Eyes: No visual disturbances ENT: No sore throat, ear pain Cardiac: No chest pain Respiratory: No shortness of breath, wheezing, or stridor Abdomen: No abdominal pain, no vomiting, No diarrhea Endocrine: No weight loss, No night sweats Extremities: No peripheral edema, cyanosis Skin: No rashes, easy bruising Neurologic: No focal weakness, trouble with speech or swollowing Urologic: No dysuria, Hematuria, or urinary frequency   Physical Exam:  ED Triage Vitals  Enc Vitals Group     BP 05/12/16 1532 132/79 mmHg     Pulse Rate 05/12/16 1532 64     Resp 05/12/16 1532 18  Temp 05/12/16 1532 98.3 F (36.8 C)     Temp Source 05/12/16 1532 Oral     SpO2 05/12/16 1532 100 %     Weight 05/12/16 1532 140 lb (63.504 kg)     Height 05/12/16 1532 6' (1.829 m)     Head Cir --      Peak Flow --      Pain Score 05/12/16 1532 5     Pain Loc --      Pain Edu? --      Excl. in GC? --     General: Awake , Alert , and Oriented times 3; GCS 15 Head: Normal cephalic , atraumatic Eyes: Pupils equal , round, reactive to light Nose/Throat: No nasal drainage, patent upper airway without erythema or exudate.  Neck: Supple, Full range of motion, No anterior adenopathy or palpable thyroid masses Lungs: Clear to ascultation without wheezes , rhonchi, or  rales Heart: Regular rate, regular rhythm without murmurs , gallops , or rubs Abdomen: Soft, non tender without rebound, guarding , or rigidity; bowel sounds positive and symmetric in all 4 quadrants. No organomegaly .        Extremities: 2 plus symmetric pulses. No edema, clubbing or cyanosis Neurologic: normal ambulation, Motor symmetric without deficits, sensory intact Skin: warm, dry, no rashes Examination of the patient's genitalia shows no penile discharge or drainage no groin adenopathy. Testicles have normal lie within the scrotal sac with some mild posterior tenderness on the left testicle without any palpable masses. No obvious masses in the inguinal ring does reveal some tenderness.  Labs:   All laboratory work was reviewed including any pertinent negatives or positives listed below:  Labs Reviewed  URINALYSIS COMPLETEWITH MICROSCOPIC ([facility] ONLY) - Abnormal; Notable for the following:    Color, Urine YELLOW (*)    APPearance CLEAR (*)    Squamous Epithelial / LPF 0-5 (*)    All other components within normal limits  Laboratory work was reviewed and showed no clinically significant abnormalities.    Radiology:  CLINICAL DATA: Left testicular pain for 3-4 days.  EXAM: SCROTAL ULTRASOUND  DOPPLER ULTRASOUND OF THE TESTICLES  TECHNIQUE: Complete ultrasound examination of the testicles, epididymis, and other scrotal structures was performed. Color and spectral Doppler ultrasound were also utilized to evaluate blood flow to the testicles.  COMPARISON: None.  FINDINGS: Right testicle  Measurements: 4.2 x 2 x 2.4 cm. No mass or microlithiasis visualized.  Left testicle  Measurements: 3.8 x 2.5 x 2.8 cm. No mass or microlithiasis visualized.  Right epididymis: 9 mm epididymal cyst. Otherwise normal right epididymis.  Left epididymis: Normal in size and appearance.  Hydrocele: None visualized.  Varicocele: None visualized.  Pulsed Doppler interrogation  of both testes demonstrates normal low resistance arterial and venous waveforms bilaterally.  IMPRESSION: 1. No testicular torsion. 2. No testicular mass.   Electronically Signed By: Elige Ko On: 05/12/2016 18:21          [place] Art/Ven Flow Abd Pelv Doppler (Final result) Result time: 05/12/16 18:21:05   Final result by Rad Results In Interface (05/12/16 18:21:05)   Narrative:   CLINICAL DATA: Left testicular pain for 3-4 days.  EXAM: SCROTAL ULTRASOUND  DOPPLER ULTRASOUND OF THE TESTICLES  TECHNIQUE: Complete ultrasound examination of the testicles, epididymis, and other scrotal structures was performed. Color and spectral Doppler ultrasound were also utilized to evaluate blood flow to the testicles.  COMPARISON: None.  FINDINGS: Right testicle  Measurements: 4.2 x 2 x 2.4 cm. No mass or  microlithiasis visualized.  Left testicle  Measurements: 3.8 x 2.5 x 2.8 cm. No mass or microlithiasis visualized.  Right epididymis: 9 mm epididymal cyst. Otherwise normal right epididymis.  Left epididymis: Normal in size and appearance.  Hydrocele: None visualized.  Varicocele: None visualized.  Pulsed Doppler interrogation of both testes demonstrates normal low resistance arterial and venous waveforms bilaterally.  IMPRESSION: 1. No testicular torsion. 2. No testicular mass.   Electronically Signed By: Elige Ko On: 05/12/2016 18:21         I personally reviewed the radiologic studies    ED Course:  I felt given his current clinical presentation and objective findings his differential narrowed to a kidney stone, urinary tract infection, left inguinal hernia, kidney stone, given the patient's current clinical presentation and negative ultrasound I felt he may have a left inguinal hernia based on the fact that his pain is worse with lifting etc. I cannot palpate or visualize an obvious mass at this time. I felt was unlikely to be  renal colic with a normal urination and just based on the intensity of the pain. Not require CAT scan evaluation at this point since he was tender mostly on the posterior surface of his left testicle. He does not have a fever and does not seem to be orchitis or epididymitis. He was advised take over-the-counter Tylenol and/or Motrin for pain. He does have a urologist and was advised to follow up as directed.  Assessment: * Left groin pain of uncertain etiology   Final Clinical Impression:  Final diagnoses:  Testicular pain, left     Plan:  Outpatient Patient was advised to return immediately if condition worsens. Patient was advised to follow up with their primary care physician or other specialized physicians involved in their outpatient care. The patient and/or family member/power of attorney had laboratory results reviewed at the bedside. All questions and concerns were addressed and appropriate discharge instructions were distributed by the nursing staff.            Jennye Moccasin, MD 05/12/16 [phone]

## 2016-05-12 NOTE — ED Notes (Signed)
Patient presents to the ED with left flank pain x 2 weeks and left testicular pain x 4 days.  Patient describes a "throbbin sensation", patient denies any swelling or discoloration.  Patient denies tenderness to testicle.  Patient denies dysuria.

## 2016-06-01 ENCOUNTER — Ambulatory Visit: Payer: 59 | Admitting: Urology

## 2016-06-01 ENCOUNTER — Telehealth: Payer: Self-pay | Admitting: Urology

## 2016-06-01 NOTE — Telephone Encounter (Signed)
FYI  Patient never got their CT scan so they cancelled their appointment for today. I gave them the number for the scheduling dept and they are supposed to call and get it scheduled. And then call me back to reschedule the appt with you for results.   Joshua Sexton

## 2016-07-23 ENCOUNTER — Ambulatory Visit: Admission: RE | Admit: 2016-07-23 | Payer: BLUE CROSS/BLUE SHIELD | Source: Ambulatory Visit

## 2017-01-02 IMAGING — CT CT RENAL STONE PROTOCOL
1 of 2 series · 4 of 32 positions shown, 9 images · non-contrast
Comparison: None.

CLINICAL DATA: Central abdominal pain and left flank pain, 1 month
duration.

EXAM:
CT ABDOMEN AND PELVIS WITHOUT CONTRAST
TECHNIQUE: Multidetector CT imaging of the abdomen and pelvis was performed
following the standard protocol without IV contrast.

[Series 4: lung windows · axial · 0.65mm/px · z∈[-163,-103]mm · 4 of 22 slices shown, 9 images]
[im 5/22  soft-tissue]
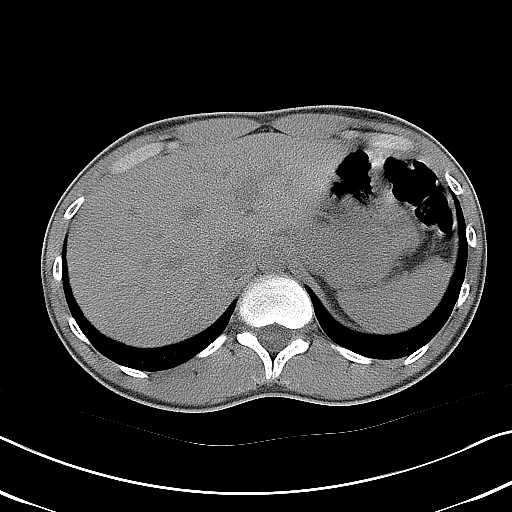
[im 5/22  lung]
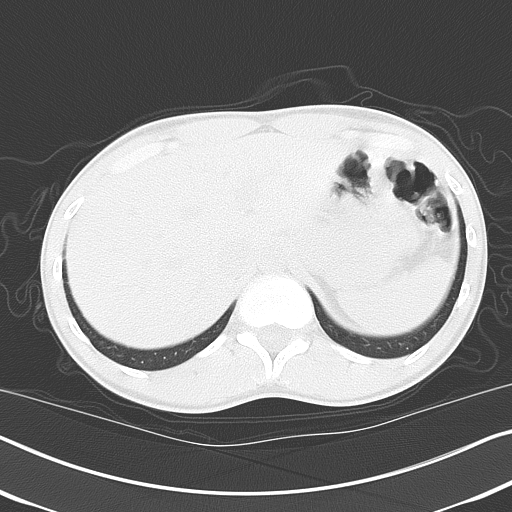
[im 5/22  bone]
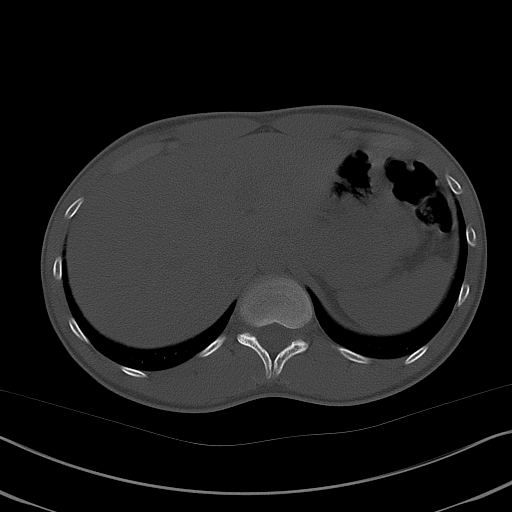
[im 9/22  soft-tissue]
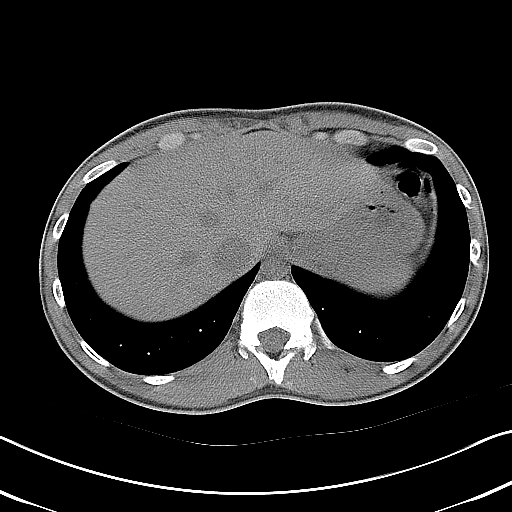
[im 9/22  lung]
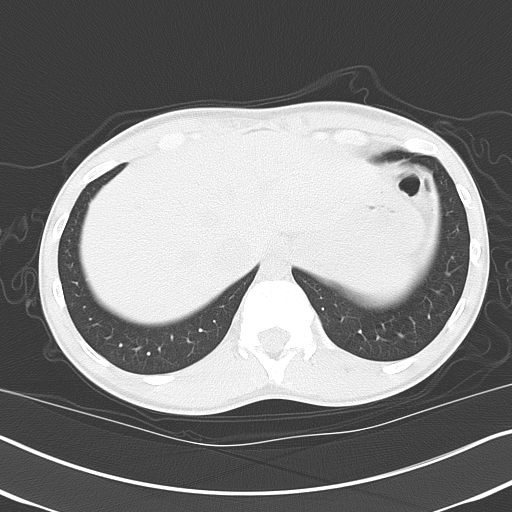
[im 13/22  soft-tissue]
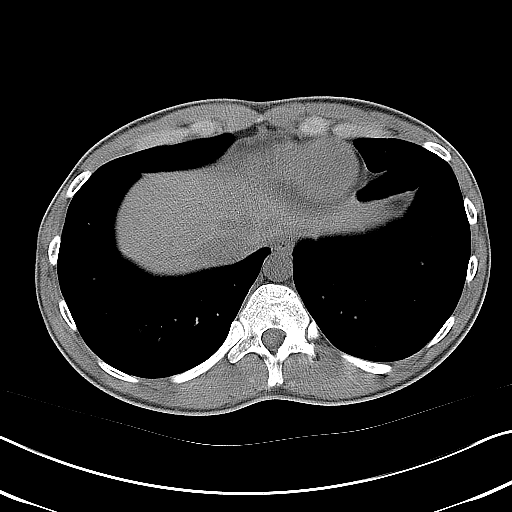
[im 13/22  lung]
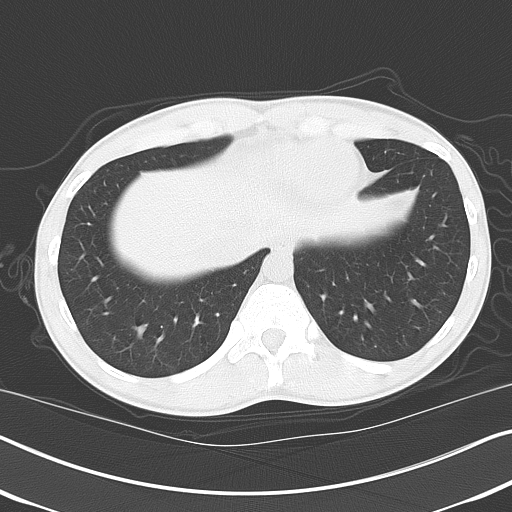
[im 17/22  soft-tissue]
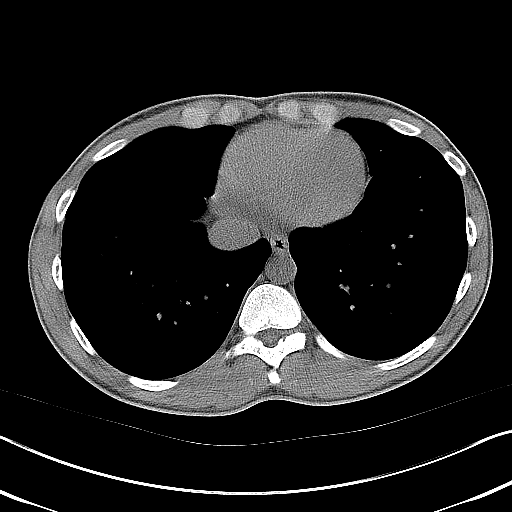
[im 17/22  lung]
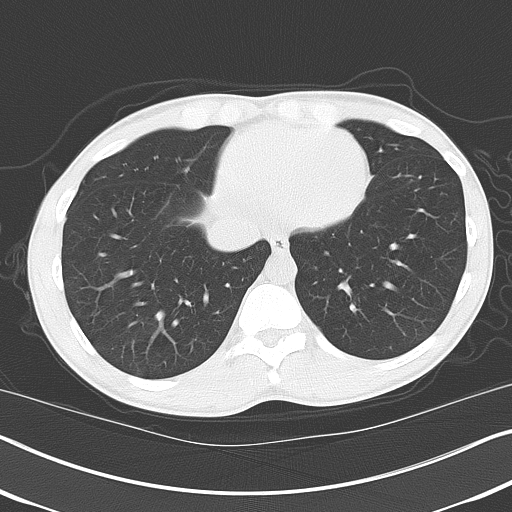

[4 of 32 positions shown; findings below may reference images not displayed]

FINDINGS: Lung bases are clear. No pleural or pericardial fluid. The liver is
normal without contrast. No calcified gallstones. The spleen is
normal. The pancreas is normal. The adrenal glands are normal. The
left kidney contains a 3 x 4 mm stone in the upper pole. There is
hyperdensity in the renal medullary regions probably representing
medullary nephrocalcinosis. There is no hydroureteronephrosis. No
stone in the left ureter. On the right, there is hyperdensity in the
medullary regions consistent with medullary nephrocalcinosis. There
is a 1 mm stone in the midportion. No hydronephrosis. No passing
stone. No stone in the bladder.

The aorta and IVC are normal. No retroperitoneal mass or adenopathy.
No free intraperitoneal fluid or air. No significant bone finding.
IMPRESSION: Hyperdensity of the medullary regions of both kidneys consistent
with medullary nephrocalcinosis.

3 x 4 mm discrete stone in the upper pole the left kidney. No
evidence of hydronephrosis or passing stone on the left.

1 mm discrete stone in the midportion of the right kidney. No
evidence of passing stone or hydronephrosis.

## 2019-12-23 ENCOUNTER — Emergency Department: Admission: EM | Admit: 2019-12-23 | Discharge: 2019-12-23 | Discharge status: Against Medical Advice | Payer: Self-pay

## 2019-12-23 NOTE — ED Notes (Signed)
First Nurse Note: Pt called x 3, not visualized in the lobby.

## 2021-03-19 ENCOUNTER — Other Ambulatory Visit: Payer: Self-pay

## 2021-03-19 ENCOUNTER — Emergency Department
Admission: EM | Admit: 2021-03-19 | Discharge: 2021-03-19 | Disposition: A | Discharge status: Home / Self Care | Payer: Self-pay | Attending: Emergency Medicine | Admitting: Emergency Medicine

## 2021-03-19 DIAGNOSIS — F17210 Nicotine dependence, cigarettes, uncomplicated: Secondary | ICD-10-CM | POA: Insufficient documentation

## 2021-03-19 DIAGNOSIS — R112 Nausea with vomiting, unspecified: Secondary | ICD-10-CM | POA: Insufficient documentation

## 2021-03-19 DIAGNOSIS — R109 Unspecified abdominal pain: Secondary | ICD-10-CM | POA: Insufficient documentation

## 2021-03-19 LAB — CBC WITH DIFFERENTIAL/PLATELET
Abs Immature Granulocytes: 0.05 10*3/uL (ref 0.00–0.07)
Basophils Absolute: 0 10*3/uL (ref 0.0–0.1)
Basophils Relative: 0 %
Eosinophils Absolute: 0 10*3/uL (ref 0.0–0.5)
Eosinophils Relative: 0 %
HCT: 46.9 % (ref 39.0–52.0)
Hemoglobin: 16.2 g/dL (ref 13.0–17.0)
Immature Granulocytes: 0 %
Lymphocytes Relative: 7 %
Lymphs Abs: 0.8 10*3/uL (ref 0.7–4.0)
MCH: 30.2 pg (ref 26.0–34.0)
MCHC: 34.5 g/dL (ref 30.0–36.0)
MCV: 87.5 fL (ref 80.0–100.0)
Monocytes Absolute: 1.1 10*3/uL — ABNORMAL HIGH (ref 0.1–1.0)
Monocytes Relative: 9 %
Neutro Abs: 9.8 10*3/uL — ABNORMAL HIGH (ref 1.7–7.7)
Neutrophils Relative %: 84 %
Platelets: 367 10*3/uL (ref 150–400)
RBC: 5.36 MIL/uL (ref 4.22–5.81)
RDW: 13.2 % (ref 11.5–15.5)
WBC: 11.7 10*3/uL — ABNORMAL HIGH (ref 4.0–10.5)
nRBC: 0 % (ref 0.0–0.2)

## 2021-03-19 LAB — URINALYSIS, COMPLETE (UACMP) WITH MICROSCOPIC
Bilirubin Urine: NEGATIVE
Glucose, UA: NEGATIVE mg/dL
Hgb urine dipstick: NEGATIVE
Ketones, ur: 20 mg/dL — AB
Leukocytes,Ua: NEGATIVE
Nitrite: NEGATIVE
Protein, ur: 100 mg/dL — AB
Specific Gravity, Urine: 1.032 — ABNORMAL HIGH (ref 1.005–1.030)
pH: 5 (ref 5.0–8.0)

## 2021-03-19 LAB — COMPREHENSIVE METABOLIC PANEL
ALT: 22 U/L (ref 0–44)
AST: 26 U/L (ref 15–41)
Albumin: 5.2 g/dL — ABNORMAL HIGH (ref 3.5–5.0)
Alkaline Phosphatase: 75 U/L (ref 38–126)
Anion gap: 12 (ref 5–15)
BUN: 15 mg/dL (ref 6–20)
CO2: 27 mmol/L (ref 22–32)
Calcium: 10 mg/dL (ref 8.9–10.3)
Chloride: 100 mmol/L (ref 98–111)
Creatinine, Ser: 1.13 mg/dL (ref 0.61–1.24)
GFR, Estimated: >60 mL/min (ref >60)
Glucose, Bld: 133 mg/dL — ABNORMAL HIGH (ref 70–99)
Potassium: 4 mmol/L (ref 3.5–5.1)
Sodium: 139 mmol/L (ref 135–145)
Total Bilirubin: 0.8 mg/dL (ref 0.3–1.2)
Total Protein: 9.5 g/dL — ABNORMAL HIGH (ref 6.5–8.1)

## 2021-03-19 LAB — LIPASE, BLOOD: Lipase: 34 U/L (ref 11–51)

## 2021-03-19 MED ORDER — ONDANSETRON HCL 4 MG/2ML IJ SOLN
4.0000 mg | Freq: Once | INTRAMUSCULAR | Status: AC
  Administered 2021-03-19: 4 mg via INTRAVENOUS
  Filled 2021-03-19: qty 2

## 2021-03-19 MED ORDER — HALOPERIDOL LACTATE 5 MG/ML IJ SOLN
5.0000 mg | Freq: Once | INTRAMUSCULAR | Status: AC
  Administered 2021-03-19: 5 mg via INTRAVENOUS
  Filled 2021-03-19: qty 1

## 2021-03-19 MED ORDER — SODIUM CHLORIDE 0.9 % IV BOLUS
1000.0000 mL | Freq: Once | INTRAVENOUS | Status: AC
  Administered 2021-03-19: 1000 mL via INTRAVENOUS

## 2021-03-19 MED ORDER — DICYCLOMINE HCL 10 MG PO CAPS: 10.0000 mg | ORAL_CAPSULE | Freq: Four times a day (QID) | ORAL | 0 refills | Status: AC

## 2021-03-19 MED ORDER — DICYCLOMINE HCL 10 MG PO CAPS
20.0000 mg | ORAL_CAPSULE | Freq: Once | ORAL | Status: AC
  Administered 2021-03-19: 20 mg via ORAL
  Filled 2021-03-19: qty 2

## 2021-03-19 MED ORDER — ONDANSETRON HCL 4 MG PO TABS: 4.0000 mg | ORAL_TABLET | Freq: Three times a day (TID) | ORAL | 0 refills | Status: AC | PRN

## 2021-03-19 NOTE — ED Provider Notes (Signed)
[facility] Emergency Department Provider Note  ____________________________________________   I have reviewed the triage vital signs and the nursing notes.   HISTORY  Chief Complaint Emesis   History limited by: Not Limited   HPI Joshua Sexton is a 30 y.o. male who presents to the emergency department today because of concern for vomiting. The patient states that the vomiting started roughly 24 hours ago.  Patient states he has had associated abdominal pain with this.  Is located primarily on the left side of his abdomen.  Patient denies any significant diarrhea.  Patient denies any unusual ingestions.  No known sick contacts.  Denies any history of abdominal issues or abdominal surgeries in the past.  Denies any fevers.   Records reviewed. Per medical record review patient has a history of tonsillectomy   Past Surgical History:  Procedure Laterality Date  . ESOPHAGOGASTRODUODENOSCOPY (EGD) WITH PROPOFOL N/A 12/26/2015   Procedure: ESOPHAGOGASTRODUODENOSCOPY (EGD) WITH PROPOFOL;  Surgeon: Wallace Cullens, MD;  Location: [facility] ENDOSCOPY;  Service: Gastroenterology;  Laterality: N/A;  . TONSILLECTOMY      Prior to Admission medications   Medication Sig Start Date End Date Taking? Authorizing Provider  bismuth subsalicylate (PEPTO-BISMOL) 262 MG chewable tablet Reported on 05/11/2016 01/16/16   [provider]  doxycycline (VIBRA-TABS) 100 MG tablet Reported on 05/11/2016 01/16/16   [provider]  metroNIDAZOLE (FLAGYL) 250 MG tablet Reported on 05/11/2016 01/16/16   [provider]  ondansetron (ZOFRAN-ODT) 4 MG disintegrating tablet Take by mouth. Reported on 05/11/2016 01/20/16   [provider]  pantoprazole (PROTONIX) 40 MG tablet Take 1 tablet (40 mg total) by mouth daily. Patient not taking: Reported on 05/11/2016 11/26/15 11/25/16  Minna Antis, MD    Allergies Bismuth subcarbonate  Family History  Problem  Relation Age of Onset  . Hypertension Mother   . Diabetes Mother   . Hypertension Maternal Aunt   . Hypertension Maternal Uncle   . Hypertension Maternal Grandmother     Social History Social History   Tobacco Use  . Smoking status: Current Every Day Smoker    Packs/day: 0.50    Types: Cigarettes  . Smokeless tobacco: Never Used  Substance Use Topics  . Alcohol use: No  . Drug use: No    Review of Systems Constitutional: No fever/chills Eyes: No visual changes. ENT: No sore throat. Cardiovascular: Denies chest pain. Respiratory: Denies shortness of breath. Gastrointestinal: Positive for abdominal pain, nausea vomiting.  Genitourinary: Negative for dysuria. Musculoskeletal: Negative for back pain. Skin: Negative for rash. Neurological: Negative for headaches, focal weakness or numbness.  ____________________________________________   PHYSICAL EXAM:  VITAL SIGNS: ED Triage Vitals  Enc Vitals Group     BP 03/19/21 0828 (!) 152/101     Pulse Rate 03/19/21 0828 85     Resp 03/19/21 0828 20     Temp 03/19/21 0828 98.7 F (37.1 C)     Temp Source 03/19/21 0828 Oral     SpO2 03/19/21 0828 99 %     Weight 03/19/21 0829 165 lb (74.8 kg)     Height 03/19/21 0829 6' (1.829 m)     Head Circumference --      Peak Flow --      Pain Score 03/19/21 0829 8   Constitutional: Alert and oriented.  Eyes: Conjunctivae are normal.  ENT      Head: Normocephalic and atraumatic.      Nose: No congestion/rhinnorhea.      Mouth/Throat: Mucous membranes  are moist.      Neck: No stridor. Hematological/Lymphatic/Immunilogical: No cervical lymphadenopathy. Cardiovascular: Normal rate, regular rhythm.  No murmurs, rubs, or gallops.  Respiratory: Normal respiratory effort without tachypnea nor retractions. Breath sounds are clear and equal bilaterally. No wheezes/rales/rhonchi. Gastrointestinal: Soft and minimally tender in the left abdomen.  Genitourinary: Deferred Musculoskeletal:  Normal range of motion in all extremities. No lower extremity edema. Neurologic:  Normal speech and language. No gross focal neurologic deficits are appreciated.  Skin:  Skin is warm, dry and intact. No rash noted. Psychiatric: Mood and affect are normal. Speech and behavior are normal. Patient exhibits appropriate insight and judgment.  ____________________________________________    LABS (pertinent positives/negatives)  Lipase 34 CBC wbc 11.7, hgb 16.2, plt 367 CMP wnl except glu 133, t pro 9.5, alb 5.2  ____________________________________________   EKG  I, Phineas Semen, attending physician, personally viewed and interpreted this EKG  EKG Time: 0837 Rate: 90 Rhythm: sinus rhythm Axis: right axis deviation Intervals: qtc 426 QRS: narrow ST changes: no st elevation Impression: abnormal ekg  ____________________________________________    RADIOLOGY  None  ____________________________________________   PROCEDURES  Procedures  ____________________________________________   INITIAL IMPRESSION / ASSESSMENT AND PLAN / ED COURSE  Pertinent labs & imaging results that were available during my care of the patient were reviewed by me and considered in my medical decision making (see chart for details).   Patient presented to the emergency department today because of concerns for nausea vomiting and some abdominal discomfort.  On exam patient had some minimal tenderness to the left abdomen.  Blood work without any concerning leukocytosis.  Patient was given medication did feel improvement.  At this point I think gastroenteritis likely.  I discussed this with the patient.  Will plan on discharging home with symptomatic care.  ___________________________________________   FINAL CLINICAL IMPRESSION(S) / ED DIAGNOSES  Final diagnoses:  Nausea and vomiting, intractability of vomiting not specified, unspecified vomiting type  Abdominal pain, unspecified abdominal  location     Note: This dictation was prepared with Dragon dictation. Any transcriptional errors that result from this process are unintentional     Phineas Semen, MD 03/19/21 1416

## 2021-03-19 NOTE — ED Triage Notes (Signed)
Pt had teeth pulled on Tuesday, emesis started Wed night. Pt has been unable to keep anything down since.P t states he started throwing up blood tinged emesis this am.  Pt also with c/o diarrhea. Pt denies pain or drainage in mouth. Pt with c/o pain in  Left back and LLQ.

## 2021-03-19 NOTE — Discharge Instructions (Signed)
Please seek medical attention for any high fevers, chest pain, shortness of breath, change in behavior, persistent vomiting, bloody stool or any other new or concerning symptoms.  

## 2021-03-19 NOTE — ED Notes (Signed)
Pt states that he has been n/v/d; pain is constant since being admitted; pt denies urinary symptoms; pt recently had two teeth pulled but denies it is related surgery and no antibiotic; pt is alert and oriented x4; no acute distress
